# Patient Record
Sex: Female | Born: 1935 | Race: White | Hispanic: No
Health system: Southern US, Community
[De-identification: ages and names within clinical notes are randomized; demographics above are authoritative.]

## PROBLEM LIST (undated history)

## (undated) DIAGNOSIS — F329 Major depressive disorder, single episode, unspecified: Secondary | ICD-10-CM

## (undated) DIAGNOSIS — F0390 Unspecified dementia without behavioral disturbance: Secondary | ICD-10-CM

## (undated) DIAGNOSIS — I10 Essential (primary) hypertension: Secondary | ICD-10-CM

## (undated) DIAGNOSIS — F32A Depression, unspecified: Secondary | ICD-10-CM

---

## 1999-07-20 ENCOUNTER — Encounter: Payer: Self-pay | Admitting: Internal Medicine

## 1999-07-20 ENCOUNTER — Ambulatory Visit (HOSPITAL_COMMUNITY): Admission: RE | Admit: 1999-07-20 | Discharge: 1999-07-20 | Payer: Self-pay | Admitting: Internal Medicine

## 2000-03-08 ENCOUNTER — Ambulatory Visit (HOSPITAL_BASED_OUTPATIENT_CLINIC_OR_DEPARTMENT_OTHER): Admission: RE | Admit: 2000-03-08 | Discharge: 2000-03-08 | Payer: Self-pay | Admitting: Otolaryngology

## 2000-05-30 ENCOUNTER — Other Ambulatory Visit: Admission: RE | Admit: 2000-05-30 | Discharge: 2000-05-30 | Payer: Self-pay | Admitting: *Deleted

## 2001-02-10 ENCOUNTER — Ambulatory Visit (HOSPITAL_COMMUNITY): Admission: RE | Admit: 2001-02-10 | Discharge: 2001-02-10 | Payer: Self-pay | Admitting: Internal Medicine

## 2001-02-10 ENCOUNTER — Encounter: Payer: Self-pay | Admitting: Internal Medicine

## 2001-02-14 ENCOUNTER — Ambulatory Visit (HOSPITAL_COMMUNITY): Admission: RE | Admit: 2001-02-14 | Discharge: 2001-02-14 | Payer: Self-pay | Admitting: Internal Medicine

## 2001-02-14 ENCOUNTER — Encounter: Payer: Self-pay | Admitting: Internal Medicine

## 2003-03-25 ENCOUNTER — Ambulatory Visit: Admission: RE | Admit: 2003-03-25 | Discharge: 2003-05-21 | Payer: Self-pay | Admitting: Radiation Oncology

## 2003-04-21 ENCOUNTER — Other Ambulatory Visit: Admission: RE | Admit: 2003-04-21 | Discharge: 2003-04-21 | Payer: Self-pay | Admitting: *Deleted

## 2003-05-11 ENCOUNTER — Ambulatory Visit (HOSPITAL_BASED_OUTPATIENT_CLINIC_OR_DEPARTMENT_OTHER): Admission: RE | Admit: 2003-05-11 | Discharge: 2003-05-11 | Payer: Self-pay | Admitting: Otolaryngology

## 2005-04-14 ENCOUNTER — Encounter: Admission: RE | Admit: 2005-04-14 | Discharge: 2005-04-14 | Payer: Self-pay | Admitting: Internal Medicine

## 2005-10-29 ENCOUNTER — Encounter: Admission: RE | Admit: 2005-10-29 | Discharge: 2005-10-29 | Payer: Self-pay | Admitting: Internal Medicine

## 2007-05-06 ENCOUNTER — Emergency Department (HOSPITAL_COMMUNITY): Admission: EM | Admit: 2007-05-06 | Discharge: 2007-05-06 | Payer: Self-pay | Admitting: Family Medicine

## 2007-06-24 ENCOUNTER — Encounter (INDEPENDENT_AMBULATORY_CARE_PROVIDER_SITE_OTHER): Payer: Self-pay | Admitting: Otolaryngology

## 2007-06-24 ENCOUNTER — Ambulatory Visit (HOSPITAL_BASED_OUTPATIENT_CLINIC_OR_DEPARTMENT_OTHER): Admission: RE | Admit: 2007-06-24 | Discharge: 2007-06-24 | Payer: Self-pay | Admitting: Otolaryngology

## 2008-09-13 ENCOUNTER — Emergency Department (HOSPITAL_COMMUNITY): Admission: EM | Admit: 2008-09-13 | Discharge: 2008-09-13 | Payer: Self-pay | Admitting: Emergency Medicine

## 2009-08-12 ENCOUNTER — Encounter: Admission: RE | Admit: 2009-08-12 | Discharge: 2009-08-12 | Payer: Self-pay | Admitting: Family Medicine

## 2010-02-19 ENCOUNTER — Encounter: Payer: Self-pay | Admitting: Internal Medicine

## 2010-03-07 ENCOUNTER — Other Ambulatory Visit: Payer: Self-pay | Admitting: Family Medicine

## 2010-03-07 DIAGNOSIS — Z09 Encounter for follow-up examination after completed treatment for conditions other than malignant neoplasm: Secondary | ICD-10-CM

## 2010-03-13 ENCOUNTER — Ambulatory Visit
Admission: RE | Admit: 2010-03-13 | Discharge: 2010-03-13 | Disposition: A | Discharge status: Home / Self Care | Payer: Medicare Other | Source: Ambulatory Visit | Attending: Family Medicine | Admitting: Family Medicine

## 2010-03-13 ENCOUNTER — Other Ambulatory Visit: Payer: Self-pay | Admitting: Family Medicine

## 2010-03-13 DIAGNOSIS — Z09 Encounter for follow-up examination after completed treatment for conditions other than malignant neoplasm: Secondary | ICD-10-CM

## 2010-05-06 LAB — CBC
HCT: 40.7 % (ref 36.0–46.0)
Hemoglobin: 14.1 g/dL (ref 12.0–15.0)
RDW: 13.2 % (ref 11.5–15.5)
WBC: 6.2 10*3/uL (ref 4.0–10.5)

## 2010-05-06 LAB — DIFFERENTIAL
Basophils Absolute: 0 10*3/uL (ref 0.0–0.1)
Lymphocytes Relative: 12 % (ref 12–46)
Lymphs Abs: 0.7 10*3/uL (ref 0.7–4.0)
Monocytes Relative: 8 % (ref 3–12)
Neutrophils Relative %: 77 % (ref 43–77)

## 2010-05-06 LAB — BASIC METABOLIC PANEL
CO2: 25 mEq/L (ref 19–32)
Calcium: 9.8 mg/dL (ref 8.4–10.5)
Chloride: 98 mEq/L (ref 96–112)

## 2010-05-06 LAB — URINALYSIS, ROUTINE W REFLEX MICROSCOPIC
Protein, ur: NEGATIVE mg/dL
pH: 7.5 (ref 5.0–8.0)

## 2010-06-13 NOTE — Op Note (Signed)
[name], [name]               ACCOUNT NO.:  [id number]   MEDICAL RECORD NO.:  [id number]          PATIENT TYPE:  AMB   LOCATION:  DSC                          FACILITY:  [facility]   PHYSICIAN:  Christopher E. Ezzard Standing, M.D.DATE OF BIRTH:  1932/06/13   DATE OF PROCEDURE:  06/24/2007  DATE OF DISCHARGE:                               OPERATIVE REPORT   PREOPERATIVE DIAGNOSIS:  Left neck mass.   POSTOPERATIVE DIAGNOSIS:  Left neck mass.   FINDINGS:  Consistent with neuroma of the great auricular nerve.   OPERATION:  Excisional biopsy of the left neck nodule.   SURGEON:  Kristine Garbe. Ezzard Standing, MD   ANESTHESIA:  Local 1% Xylocaine with epinephrine.   COMPLICATIONS:  None.   BRIEF CLINICAL NOTE:  Ashley Aguirre is a 77 year old female who presents  here because of a persistent left neck nodule, she has had for a number  of months.  On examination this measures approximately one-half to the 2  cm in size, lies directly on top of the sternocleidomastoid muscle.  It  is in the region of a previous scar from a parotidectomy she had in  1990.  The nodule is a little bit tender to palpation.  She feels like  it has been getting a little bit larger and was taken to the operating  room at this time for excisional biopsy of left neck node.   DESCRIPTION OF PROCEDURE:  The patient was brought to the operating  room, the neck was prepped with Betadine solution.  The area of the  nodule was injected with Xylocaine with epinephrine for local  anesthetic.  An incision was made directly over the nodule, which  incorporated the previous parotid incision.  Dissection was carried down  to a firm nodule.  It appeared to represent a neuroma or a fibroma that  involved the remaining greater auricular nerve, as the greater auricular  nerve went directly into this.  The nodule was dissected off the  sternocleidomastoid muscle.  The great auricular nerve was transected  proximally and the nodule was  removed.  This was sent to pathology.  There was minimal bleeding, controlled with pressure and Xylocaine with  epinephrine.  Small wound was then closed with 4-0 chromic sutures  subcutaneously and 6-0 nylon reapproximating skin edges.  Bacitracin  ointment dressing was applied.  Bird tolerated this well and was  subsequently discharged to home.   DISPOSITION:  Ashley Aguirre is discharged to home on Tylenol p.r.n. pain.  I  will follow up her in my office in 6 days to review pathology and have  sutures removed.           ______________________________  Kristine Garbe Ezzard Standing, M.D.    CEN/MEDQ  D:  06/24/2007  T:  06/25/2007  Job:  [id number]

## 2010-06-16 NOTE — Op Note (Signed)
[facility]  Patient:    Ashley Aguirre, Ashley Aguirre                      MRN: [id number] Proc. Date: 03/08/00 Adm. Date:  [id number] Attending:  Carlean Purl CC:         Lilly Cove, M.D.   Operative Report  PREOPERATIVE DIAGNOSIS:  Left earlobe mass, 3 cm size.  POSTOPERATIVE DIAGNOSIS:  Left earlobe mass, 3 cm size, consistent with probable keloid.  PROCEDURE:  Excision of 3 cm left earlobe mass with complex closure with local skin flap.  SURGEON:  Kristine Garbe. Ezzard Standing, M.D.  ANESTHESIA:  Local 2% Xylocaine with 1:100,000 epinephrine, 4 cc.  COMPLICATIONS:  None.  BRIEF CLINICAL NOTE:  [name] is a 77 year old female who has had previous left carotid surgery several years ago.  She had apparent lesion that was removed two years ago in [place], and pathology report on this apparently represented a keloid.  She has had recurrence of the mass on the left earlobe replacing most of the earlobe with scar tissue from the earlobe to the left neck.  She is taken to the operating room at this time for excision of left earlobe mass with revision of scar tissue.  DESCRIPTION OF PROCEDURE:  The patient was taken to the operating room.  The ear was prepped with Betadine solution, and the earlobe was injected with 4 cc of Xylocaine with epinephrine.  The mass which represented apparent scar tissue was excised.  Flaps were elevated locally, and the defect was closed with 5-0 Vicryl sutures subcutaneously and 6-0 nylon on the skin.  Bacitracin ointment was applied, and a mastoid type ear dressing was applied.  This completed the procedure.  Kiyanna was sent home on Keflex 500 t.i.d. for 5 days, Tylenol and Tylenol No. 3 p.r.n. pain.  Will have her follow up in my office in one week to review pathology and to have sutures removed. DD:  03/08/00 TD:  03/09/00 Job: [id number]

## 2010-06-16 NOTE — Op Note (Signed)
[name], CURA                         ACCOUNT NO.:  [id number]   MEDICAL RECORD NO.:  [id number]                   PATIENT TYPE:  AMB   LOCATION:  DSC                                  FACILITY:  [facility]   PHYSICIAN:  Christopher E. Ezzard Standing, M.D.         DATE OF BIRTH:  1932/10/27   DATE OF PROCEDURE:  05/11/2003  DATE OF DISCHARGE:                                 OPERATIVE REPORT   PREOPERATIVE DIAGNOSIS:  Left ear Keloid (2.5 cm).   POSTOPERATIVE DIAGNOSIS:  Left ear Keloid (2.5 cm).   OPERATION PERFORMED:  Excision of left ear keloid with intermediate closure.   SURGEON:  Kristine Garbe. Ezzard Standing, M.D.   ANESTHESIA:  1% Xylocaine with 1:100,000 epinephrine.   COMPLICATIONS:  None.   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  She  had excision of the keloid twice before and has had recurrence of the keloid  times two.  Now has an approximately 2 to 2.5 cm keloid behind her left ear  lobe.  She is taken to the operating room at this time for excision of  keloid with primary closure.  Will have postoperative radiation therapy to  try to reduce recurrence of the keloid.   DESCRIPTION OF PROCEDURE:  The left ear was prepped with Betadine solution,  was injected with Xylocaine with epinephrine for local anesthetic.  The  keloid was excised from behind the left earlobe.  There was approximately 2  to 3 cm defect after excision of the keloid.  Small 1 cm skin flaps were  elevated anteriorly and posteriorly.  The defect was closed with 5-0 and 6-0  nylon sutures.  After closing the incision, bacitracin ointment and dressing  was applied.   DISPOSITION:  Ashley Aguirre is discharged home.  She is scheduled for postoperative  radiation therapy this afternoon.  She was given Tylenol and Tylenol #3 as  needed for pain.  Keflex 500 mg three times daily for three days.  Will have  her follow  up in my office in one week for recheck and remove the sutures.                                               Kristine Garbe. Ezzard Standing, M.D.    CEN/MEDQ  D:  05/11/2003  T:  05/12/2003  Job:  [id number]

## 2010-10-24 LAB — CBC
HCT: 39.4
Hemoglobin: 13.5
MCV: 95.6
RBC: 4.13

## 2010-10-24 LAB — DIFFERENTIAL
Basophils Absolute: 0
Basophils Relative: 0
Eosinophils Absolute: 0.3
Lymphs Abs: 2.5
Monocytes Relative: 10
Neutro Abs: 6.1
Neutrophils Relative %: 61

## 2010-10-24 LAB — POCT I-STAT, CHEM 8
BUN: 24 — ABNORMAL HIGH
Calcium, Ion: 1.2
Chloride: 100
Creatinine, Ser: 1.6 — ABNORMAL HIGH
HCT: 40
Hemoglobin: 13.6
Potassium: 4.1
Sodium: 132 — ABNORMAL LOW

## 2010-10-24 LAB — URINALYSIS, ROUTINE W REFLEX MICROSCOPIC
Bilirubin Urine: NEGATIVE
Glucose, UA: NEGATIVE
Hgb urine dipstick: NEGATIVE
Ketones, ur: NEGATIVE
Protein, ur: NEGATIVE

## 2011-07-10 ENCOUNTER — Other Ambulatory Visit: Payer: Self-pay | Admitting: Family Medicine

## 2011-07-10 DIAGNOSIS — Z780 Asymptomatic menopausal state: Secondary | ICD-10-CM

## 2011-07-10 DIAGNOSIS — Z1231 Encounter for screening mammogram for malignant neoplasm of breast: Secondary | ICD-10-CM

## 2011-07-30 ENCOUNTER — Other Ambulatory Visit: Payer: Medicare Other

## 2011-07-30 ENCOUNTER — Ambulatory Visit
Admission: RE | Admit: 2011-07-30 | Discharge: 2011-07-30 | Disposition: A | Discharge status: Home / Self Care | Payer: Medicare Other | Source: Ambulatory Visit | Attending: Family Medicine | Admitting: Family Medicine

## 2011-07-30 DIAGNOSIS — Z1231 Encounter for screening mammogram for malignant neoplasm of breast: Secondary | ICD-10-CM

## 2011-08-07 ENCOUNTER — Ambulatory Visit
Admission: RE | Admit: 2011-08-07 | Discharge: 2011-08-07 | Disposition: A | Discharge status: Home / Self Care | Payer: Medicare Other | Source: Ambulatory Visit | Attending: Family Medicine | Admitting: Family Medicine

## 2011-08-07 DIAGNOSIS — Z780 Asymptomatic menopausal state: Secondary | ICD-10-CM

## 2012-07-04 ENCOUNTER — Other Ambulatory Visit: Payer: Self-pay

## 2012-07-04 DIAGNOSIS — Z1231 Encounter for screening mammogram for malignant neoplasm of breast: Secondary | ICD-10-CM

## 2012-08-06 ENCOUNTER — Ambulatory Visit
Admission: RE | Admit: 2012-08-06 | Discharge: 2012-08-06 | Disposition: A | Discharge status: Home / Self Care | Payer: Medicare Other | Source: Ambulatory Visit

## 2012-08-06 DIAGNOSIS — Z1231 Encounter for screening mammogram for malignant neoplasm of breast: Secondary | ICD-10-CM

## 2012-08-07 ENCOUNTER — Other Ambulatory Visit: Payer: Self-pay | Admitting: Family Medicine

## 2012-08-07 DIAGNOSIS — R928 Other abnormal and inconclusive findings on diagnostic imaging of breast: Secondary | ICD-10-CM

## 2012-08-22 ENCOUNTER — Ambulatory Visit
Admission: RE | Admit: 2012-08-22 | Discharge: 2012-08-22 | Disposition: A | Discharge status: Home / Self Care | Payer: Medicare Other | Source: Ambulatory Visit | Attending: Family Medicine | Admitting: Family Medicine

## 2012-08-22 DIAGNOSIS — R928 Other abnormal and inconclusive findings on diagnostic imaging of breast: Secondary | ICD-10-CM

## 2013-09-01 ENCOUNTER — Other Ambulatory Visit: Payer: Self-pay

## 2013-09-01 DIAGNOSIS — Z1231 Encounter for screening mammogram for malignant neoplasm of breast: Secondary | ICD-10-CM

## 2013-09-16 ENCOUNTER — Ambulatory Visit
Admission: RE | Admit: 2013-09-16 | Discharge: 2013-09-16 | Disposition: A | Discharge status: Home / Self Care | Payer: Medicare Other | Source: Ambulatory Visit

## 2013-09-16 DIAGNOSIS — Z1231 Encounter for screening mammogram for malignant neoplasm of breast: Secondary | ICD-10-CM

## 2014-02-23 ENCOUNTER — Other Ambulatory Visit: Payer: Self-pay | Admitting: Family Medicine

## 2014-02-23 DIAGNOSIS — E2839 Other primary ovarian failure: Secondary | ICD-10-CM

## 2014-02-26 ENCOUNTER — Other Ambulatory Visit: Payer: Medicare Other

## 2014-03-18 ENCOUNTER — Inpatient Hospital Stay: Admission: RE | Admit: 2014-03-18 | Payer: Medicare Other | Source: Ambulatory Visit

## 2014-03-23 ENCOUNTER — Ambulatory Visit
Admission: RE | Admit: 2014-03-23 | Discharge: 2014-03-23 | Disposition: A | Discharge status: Home / Self Care | Payer: Medicare Other | Source: Ambulatory Visit | Attending: Family Medicine | Admitting: Family Medicine

## 2014-03-23 DIAGNOSIS — E2839 Other primary ovarian failure: Secondary | ICD-10-CM

## 2015-06-15 ENCOUNTER — Other Ambulatory Visit: Payer: Self-pay

## 2015-06-15 DIAGNOSIS — Z1231 Encounter for screening mammogram for malignant neoplasm of breast: Secondary | ICD-10-CM

## 2015-06-30 ENCOUNTER — Ambulatory Visit
Admission: RE | Admit: 2015-06-30 | Discharge: 2015-06-30 | Disposition: A | Discharge status: Home / Self Care | Payer: Medicare Other | Source: Ambulatory Visit

## 2015-06-30 DIAGNOSIS — Z1231 Encounter for screening mammogram for malignant neoplasm of breast: Secondary | ICD-10-CM

## 2016-03-05 ENCOUNTER — Emergency Department (HOSPITAL_COMMUNITY): Payer: Medicare Other

## 2016-03-05 ENCOUNTER — Emergency Department (HOSPITAL_COMMUNITY)
Admission: EM | Admit: 2016-03-05 | Discharge: 2016-03-05 | Disposition: A | Discharge status: Home / Self Care | Payer: Medicare Other | Attending: Emergency Medicine | Admitting: Emergency Medicine

## 2016-03-05 ENCOUNTER — Encounter (HOSPITAL_COMMUNITY): Payer: Self-pay | Admitting: Emergency Medicine

## 2016-03-05 DIAGNOSIS — W19XXXA Unspecified fall, initial encounter: Secondary | ICD-10-CM | POA: Insufficient documentation

## 2016-03-05 DIAGNOSIS — Z79899 Other long term (current) drug therapy: Secondary | ICD-10-CM | POA: Insufficient documentation

## 2016-03-05 DIAGNOSIS — Y9289 Other specified places as the place of occurrence of the external cause: Secondary | ICD-10-CM | POA: Diagnosis not present

## 2016-03-05 DIAGNOSIS — I10 Essential (primary) hypertension: Secondary | ICD-10-CM | POA: Insufficient documentation

## 2016-03-05 DIAGNOSIS — Y999 Unspecified external cause status: Secondary | ICD-10-CM | POA: Insufficient documentation

## 2016-03-05 DIAGNOSIS — Y939 Activity, unspecified: Secondary | ICD-10-CM | POA: Diagnosis not present

## 2016-03-05 DIAGNOSIS — Z7982 Long term (current) use of aspirin: Secondary | ICD-10-CM | POA: Insufficient documentation

## 2016-03-05 DIAGNOSIS — S0990XA Unspecified injury of head, initial encounter: Secondary | ICD-10-CM | POA: Insufficient documentation

## 2016-03-05 HISTORY — DX: Major depressive disorder, single episode, unspecified: F32.9

## 2016-03-05 HISTORY — DX: Depression, unspecified: F32.A

## 2016-03-05 HISTORY — DX: Unspecified dementia, unspecified severity, without behavioral disturbance, psychotic disturbance, mood disturbance, and anxiety: F03.90

## 2016-03-05 HISTORY — DX: Essential (primary) hypertension: I10

## 2016-03-05 NOTE — ED Triage Notes (Signed)
Pt is from [facility].  Pt was in dining room.  Staff found her laying on the floor with chair turned over beside her.  Other residents state that Pt fell striking her left shoulder and head.  No LOC reported.  Dementia Dx.  Pt is alert to self only at baseline and is currently at baseline.  Pt C/O no pain.

## 2016-03-05 NOTE — Discharge Instructions (Signed)
Continue taking your home medications as prescribed. I recommend following up with your primary care provider within the next week for follow-up evaluation. Please return to the Emergency Department if symptoms worsen or new onset of fever, headache, visual changes, confusion, altered mental status, chest pain, difficulty breathing, abdominal pain, vomiting, weakness.

## 2016-03-05 NOTE — ED Notes (Signed)
Patient given water and crackers for PO challenge.

## 2016-03-05 NOTE — ED Provider Notes (Signed)
[facility]-EMERGENCY DEPT Provider Note   CSN: [id number] Arrival date & time: 03/05/16  1227     History   Chief Complaint Chief Complaint  Patient presents with  . Fall    HPI Ashley Aguirre is a 83 y.o. female.  HPI   Patient is a 83 year old female with history of dementia, depression, primary hyper parathyroidism and hypertension who presents the ED status post unwitnessed fall from nursing facility. EMS reports that patient was found laying on the floor on her side with her chair turned over beside her in the dining hall. EMS report other residents saw the patient fall out of her chair resulting her landing on her left shoulder and hitting her head. Denies LOC. Patient reports she was standing and in the process of going to sit in her chair when she fell on her left side. Pt denies LOC. Denies any pain or complaints at this time. Denies fever, chills, CP, SOB, lightheadedness, HA, visual changes, cough, abdominal pain, N/V/D, urinary sxs, rash, numbness, tingling, weakness. Pt's paperwork at bedside from nursing facility denies pt currently being on any anticoagulants. Pt reports using walker for baseline ambulation.  I spoke with pt's nurse at [facility] who presents pt fell out of her chair which resulted in her "clipping her head against the dining table" and then falling on the floor and hitting her head on the ground. Denies LOC. Nurse reports pt was at baseline mental status. Denies pt having any recent changes or new sxs recently.    LEVEL V CAVEAT- Dementia  Past Medical History:  Diagnosis Date  . Dementia   . Depression   . Hypertension     There are no active problems to display for this patient.   History reviewed. No pertinent surgical history.  OB History    No data available       Home Medications    Prior to Admission medications   Medication Sig Start Date End Date Taking? Authorizing Provider  acetaminophen (TYLENOL) 500 MG tablet Take 500 mg by  mouth every 4 (four) hours as needed (fever).   Yes Historical Provider, MD  amLODipine (NORVASC) 5 MG tablet Take 10 mg by mouth daily.   Yes Historical Provider, MD  aspirin EC 81 MG tablet Take 81 mg by mouth daily.   Yes Historical Provider, MD  benzonatate (TESSALON) 100 MG capsule Take 100 mg by mouth every 6 (six) hours as needed for cough.   Yes Historical Provider, MD  Carboxymeth-Glycerin-Polysorb (REFRESH OPTIVE ADVANCED) 0.5-1-0.5 % SOLN Place 1 drop into both eyes 3 (three) times daily.   Yes Historical Provider, MD  celecoxib (CELEBREX) 100 MG capsule Take 100 mg by mouth at bedtime.   Yes Historical Provider, MD  cholecalciferol (VITAMIN D) 1000 units tablet Take 1,000 Units by mouth daily.   Yes Historical Provider, MD  cyanocobalamin 500 MCG tablet Take 500 mcg by mouth daily.   Yes Historical Provider, MD  donepezil (ARICEPT) 10 MG tablet Take 10 mg by mouth at bedtime.   Yes Historical Provider, MD  FLUoxetine (PROZAC) 10 MG capsule Take 10 mg by mouth at bedtime.   Yes Historical Provider, MD  Lifitegrast Benay Spice) 5 % SOLN Apply 1 drop to eye 2 (two) times daily.   Yes Historical Provider, MD  Omega-3 Fatty Acids (FISH OIL) 1000 MG CAPS Take 2,000 mg by mouth 2 (two) times daily.   Yes Historical Provider, MD  simvastatin (ZOCOR) 20 MG tablet Take 20 mg by mouth  at bedtime.   Yes Historical Provider, MD    Family History History reviewed. No pertinent family history.  Social History Social History  Substance Use Topics  . Smoking status: Not on file  . Smokeless tobacco: Not on file  . Alcohol use No     Allergies   Patient has no known allergies.   Review of Systems Review of Systems  Unable to perform ROS: Dementia     Physical Exam Updated Vital Signs BP 147/62 (BP Location: Left Arm)   Pulse (!) 57   Temp 98.3 F (36.8 C) (Oral)   Resp 16   SpO2 95%   Physical Exam  Constitutional: She appears well-developed and well-nourished. No distress.    Well, nontoxic appearing elderly female in no acute distress  HENT:  Head: Normocephalic and atraumatic. Head is without raccoon's eyes, without Battle's sign, without abrasion, without contusion and without laceration.  Right Ear: Tympanic membrane normal. No hemotympanum.  Left Ear: Tympanic membrane normal. No hemotympanum.  Nose: Nose normal. No sinus tenderness, nasal deformity, septal deviation or nasal septal hematoma. No epistaxis.  Mouth/Throat: Uvula is midline, oropharynx is clear and moist and mucous membranes are normal. No oropharyngeal exudate, posterior oropharyngeal edema, posterior oropharyngeal erythema or tonsillar abscesses. No tonsillar exudate.  Eyes: Conjunctivae and EOM are normal. Pupils are equal, round, and reactive to light. Right eye exhibits no discharge. Left eye exhibits no discharge. No scleral icterus.  Neck: Normal range of motion. Neck supple.  Cardiovascular: Normal rate, regular rhythm, normal heart sounds and intact distal pulses.   Pulmonary/Chest: Effort normal and breath sounds normal. No respiratory distress. She has no wheezes. She has no rales. She exhibits no tenderness.  Abdominal: Soft. Bowel sounds are normal. She exhibits no distension and no mass. There is no tenderness. There is no rebound and no guarding. No hernia.  Musculoskeletal: Normal range of motion. She exhibits no edema, tenderness or deformity.       Right shoulder: Normal.       Left shoulder: Normal.       Right hip: Normal.       Left hip: Normal.  No cervical, thoracic, or lumbar spine midline TTP. Full ROM of bilateral upper and lower extremities with 5/5 strength.  FROM of bilateral hips with 5/5 strength, no pelvic instability.  2+ radial and DP pulses. Sensation grossly intact.   Lymphadenopathy:    She has no cervical adenopathy.  Neurological: She is alert. She has normal strength. No cranial nerve deficit or sensory deficit. Coordination normal. GCS eye subscore is 4.  GCS verbal subscore is 5. GCS motor subscore is 6.  Pt alert and oriented to person and place only. Pt cooperative and following commands. Pt spontaneously moving all 4 extremities.   Skin: Skin is warm and dry. She is not diaphoretic.  Nursing note and vitals reviewed.    ED Treatments / Results  Labs (all labs ordered are listed, but only abnormal results are displayed) Labs Reviewed - No data to display  EKG  EKG Interpretation None       Radiology Ct Head Wo Contrast  Result Date: 03/05/2016 CLINICAL DATA:  83 year old female found lying on floor next to chair. May have struck head. No loss of consciousness reported. Back to baseline. Initial encounter. EXAM: CT HEAD WITHOUT CONTRAST TECHNIQUE: Contiguous axial images were obtained from the base of the skull through the vertex without intravenous contrast. COMPARISON:  09/13/2008 CT. FINDINGS: Brain: No intracranial hemorrhage or CT  evidence of large acute infarct. Mild chronic microvascular changes. Global atrophy without hydrocephalus. No intracranial mass lesion noted on this unenhanced exam. Vascular: Vascular calcifications Skull: No skull fracture Sinuses/Orbits: No acute orbital abnormality post lens replacement. Visualized sinuses are clear. Other: Negative IMPRESSION: No skull fracture or intracranial hemorrhage. No CT evidence of large acute infarct. Atrophy. Electronically Signed   By: Lacy DuverneySteven  Olson M.D.   On: 03/05/2016 13:47    Procedures Procedures (including critical care time)  Medications Ordered in ED Medications - No data to display   Initial Impression / Assessment and Plan / ED Course  I have reviewed the triage vital signs and the nursing notes.  Pertinent labs & imaging results that were available during my care of the patient were reviewed by me and considered in my medical decision making (see chart for details).     Patient presents from nursing facility with reported mechanical fall with associated  head injury. Denies LOC. Denies use of anticoagulants. Nursing facility denies pt having any other changes or new/concerning sxs. Patient is at baseline mental status. History of dementia. Patient denies any pain or complaints. VSS. Exam unremarkable. No evidence of head injury. No neuro deficits. Full range of motion of bilateral upper and lower extremities is 5 out of 5 strength. CT head unremarkable. Patient able to stand and ambulate with walker in the ED. Discussed pt with Dr. Clydene PughKnott. Plan to d/c pt back to nursing facility with PCP follow up. Discussed results and plan for discharge with patient and daughter at bedside.  Final Clinical Impressions(s) / ED Diagnoses   Final diagnoses:  Fall, initial encounter  Injury of head, initial encounter    New Prescriptions New Prescriptions   No medications on file     Barrett Henleicole Elizabeth Verlia Kaney, [place] JerseyPA-C 03/05/16 1514    Lyndal Pulleyaniel Knott, MD 03/05/16 2012

## 2016-03-05 NOTE — ED Notes (Signed)
Pt was able to ambulate in room with walker.  Clean brief put on Pt and peri care provided.

## 2016-10-01 ENCOUNTER — Emergency Department (HOSPITAL_COMMUNITY): Payer: Medicare Other

## 2016-10-01 ENCOUNTER — Emergency Department (HOSPITAL_COMMUNITY)
Admission: EM | Admit: 2016-10-01 | Discharge: 2016-10-01 | Disposition: A | Discharge status: Home / Self Care | Payer: Medicare Other | Attending: Emergency Medicine | Admitting: Emergency Medicine

## 2016-10-01 ENCOUNTER — Encounter (HOSPITAL_COMMUNITY): Payer: Self-pay | Admitting: Emergency Medicine

## 2016-10-01 DIAGNOSIS — M791 Myalgia: Secondary | ICD-10-CM | POA: Insufficient documentation

## 2016-10-01 DIAGNOSIS — Z7982 Long term (current) use of aspirin: Secondary | ICD-10-CM | POA: Insufficient documentation

## 2016-10-01 DIAGNOSIS — M25551 Pain in right hip: Secondary | ICD-10-CM | POA: Diagnosis present

## 2016-10-01 DIAGNOSIS — I10 Essential (primary) hypertension: Secondary | ICD-10-CM | POA: Diagnosis not present

## 2016-10-01 DIAGNOSIS — Z79899 Other long term (current) drug therapy: Secondary | ICD-10-CM | POA: Insufficient documentation

## 2016-10-01 DIAGNOSIS — F0390 Unspecified dementia without behavioral disturbance: Secondary | ICD-10-CM | POA: Diagnosis not present

## 2016-10-01 DIAGNOSIS — M7918 Myalgia, other site: Secondary | ICD-10-CM

## 2016-10-01 DIAGNOSIS — W19XXXA Unspecified fall, initial encounter: Secondary | ICD-10-CM

## 2016-10-01 NOTE — ED Triage Notes (Signed)
Per EMS-states she fell a week ago today-was not evaluated-facility states she grimaces when bearing weight on right hip-abrasions to right knee

## 2016-10-01 NOTE — ED Provider Notes (Signed)
[facility]-EMERGENCY DEPT Provider Note   CSN: [id number] Arrival date & time: 10/01/16  1410     History   Chief Complaint Chief Complaint  Patient presents with  . Hip Pain    HPI Ashley Aguirre is a 83 y.o. female.  HPI  83 y.o. female with a hx of HTN, Dementia, presents to the Emergency Department today via EMS due to fall x 1 week ago. Pt resides at assisted nursing facility. Per daughter, who was at facility, pt was yelling out in pain and grimacing when bearing weight. Sent for eval due to falls. No head trauma or LOC. Pt does not use blood thinners. Pt denies pain currently. Denies pain with ROM of legs. No numbness/tingling. No headaches. No CP/SOB/ABD pain. No fevers. No cough. No dysuria. No other symptoms noted.    Past Medical History:  Diagnosis Date  . Dementia   . Depression   . Hypertension     There are no active problems to display for this patient.   History reviewed. No pertinent surgical history.  OB History    No data available       Home Medications    Prior to Admission medications   Medication Sig Start Date End Date Taking? Authorizing Provider  acetaminophen (TYLENOL) 500 MG tablet Take 500 mg by mouth every 4 (four) hours as needed (fever).    [provider]  amLODipine (NORVASC) 5 MG tablet Take 10 mg by mouth daily.    [provider]  aspirin EC 81 MG tablet Take 81 mg by mouth daily.    [provider]  benzonatate (TESSALON) 100 MG capsule Take 100 mg by mouth every 6 (six) hours as needed for cough.    [provider]  Carboxymeth-Glycerin-Polysorb (REFRESH OPTIVE ADVANCED) 0.5-1-0.5 % SOLN Place 1 drop into both eyes 3 (three) times daily.    [provider]  celecoxib (CELEBREX) 100 MG capsule Take 100 mg by mouth at bedtime.    [provider]  cholecalciferol (VITAMIN D) 1000 units tablet Take 1,000 Units by mouth daily.    [provider]  cyanocobalamin 500 MCG tablet  Take 500 mcg by mouth daily.    [provider]  donepezil (ARICEPT) 10 MG tablet Take 10 mg by mouth at bedtime.    [provider]  FLUoxetine (PROZAC) 10 MG capsule Take 10 mg by mouth at bedtime.    [provider]  Lifitegrast Benay Spice) 5 % SOLN Apply 1 drop to eye 2 (two) times daily.    [provider]  Omega-3 Fatty Acids (FISH OIL) 1000 MG CAPS Take 2,000 mg by mouth 2 (two) times daily.    [provider]  simvastatin (ZOCOR) 20 MG tablet Take 20 mg by mouth at bedtime.    [provider]    Family History No family history on file.  Social History Social History  Substance Use Topics  . Smoking status: Not on file  . Smokeless tobacco: Not on file  . Alcohol use No     Allergies   Patient has no known allergies.   Review of Systems Review of Systems  Unable to perform ROS: Dementia     Physical Exam Updated Vital Signs BP (!) 141/58   Pulse 60   Temp 97.9 F (36.6 C) (Oral)   Resp 15   SpO2 96%   Physical Exam  Constitutional: She is oriented to person, place, and time. Vital signs are normal. She appears  well-developed and well-nourished. No distress.  HENT:  Head: Normocephalic and atraumatic.  Right Ear: Hearing, tympanic membrane, external ear and ear canal normal.  Left Ear: Hearing, tympanic membrane, external ear and ear canal normal.  Nose: Nose normal.  Mouth/Throat: Uvula is midline, oropharynx is clear and moist and mucous membranes are normal. No trismus in the jaw. No oropharyngeal exudate, posterior oropharyngeal erythema or tonsillar abscesses.  Eyes: Pupils are equal, round, and reactive to light. Conjunctivae and EOM are normal.  Neck: Normal range of motion. Neck supple. No tracheal deviation present.  Cardiovascular: Normal rate, regular rhythm, S1 normal, S2 normal, normal heart sounds, intact distal pulses and normal pulses.   Pulmonary/Chest: Effort normal and breath sounds normal.  No respiratory distress. She has no decreased breath sounds. She has no wheezes. She has no rhonchi. She has no rales.  Abdominal: Normal appearance and bowel sounds are normal. There is no tenderness.  Musculoskeletal: Normal range of motion.  BLE exam without pain on internal/external rotation of left and right hip. No pain with adduction and abduction. Able to lift legs without pain. ROM knee/ankle without pain. No palpable or visible deformities. NVI  Neurological: She is alert and oriented to person, place, and time.  Skin: Skin is warm and dry.  Psychiatric: She has a normal mood and affect. Her speech is normal and behavior is normal. Thought content normal.  Nursing note and vitals reviewed.  ED Treatments / Results  Labs (all labs ordered are listed, but only abnormal results are displayed) Labs Reviewed - No data to display  EKG  EKG Interpretation None       Radiology Dg Hip Unilat  With Pelvis 2-3 Views Right  Result Date: 10/01/2016 CLINICAL DATA:  Right hip pain following a fall today. EXAM: DG HIP (WITH OR WITHOUT PELVIS) 2-3V RIGHT COMPARISON:  None. FINDINGS: Diffuse osteopenia. No fracture or dislocation seen. Lower lumbar spine degenerative changes. Mild left hip degenerative changes. IMPRESSION: 1. No fracture or dislocation. 2. Lower lumbar spine degenerative changes and mild left hip degenerative changes. Electronically Signed   By: Beckie Salts M.D.   On: 10/01/2016 15:09    Procedures Procedures (including critical care time)  Medications Ordered in ED Medications - No data to display   Initial Impression / Assessment and Plan / ED Course  I have reviewed the triage vital signs and the nursing notes.  Pertinent labs & imaging results that were available during my care of the patient were reviewed by me and considered in my medical decision making (see chart for details).  Final Clinical Impressions(s) / ED Diagnoses     {I have reviewed the relevant  previous healthcare records.  {I obtained HPI from historian.   ED Course:  Assessment: Pt is a 83 y.o. female with a hx of HTN, Dementia, presents to the Emergency Department today via EMS due to fall x 1 week ago. Pt resides at assisted nursing facility. Per daughter, who was at facility, pt was yelling out in pain and grimacing when bearing weight. Sent for eval due to falls. No head trauma or LOC. Pt does not use blood thinners. Pt denies pain currently. Denies pain with ROM of legs. No numbness/tingling. No headaches. No CP/SOB/ABD pain. No fevers. No cough. No dysuria. On exam, pt in NAD. Nontoxic/nonseptic appearing. VSS. Afebrile. Lungs CTA. Heart RRR. Abdomen nontender soft. BLE exam without pain on internal/external rotation of left and right hip. No pain with adduction and abduction. Able to lift  legs without pain. ROM knee/ankle without pain. No palpable or visible deformities. NVI. Xray imaging unremarkable. Plan is to DC home to faiclity. Seen by supervising physician who agrees with plan. At time of discharge, Patient is in no acute distress. Vital Signs are stable. Patient is able to ambulate. Patient able to tolerate PO.   Disposition/Plan:  DC Home Additional Verbal discharge instructions given and discussed with patient.  Pt Instructed to f/u with PCP in the next week for evaluation and treatment of symptoms. Return precautions given Pt acknowledges and agrees with plan  Supervising Physician Lorre NickAllen, Anthony, MD  Final diagnoses:  Fall, initial encounter  Musculoskeletal pain    New Prescriptions New Prescriptions   No medications on file     Wilber BihariMohr, Amberrose Friebel, PA-C 10/01/16 1528    Lorre NickAllen, Anthony, MD 10/01/16 [id number]

## 2016-10-01 NOTE — ED Notes (Signed)
Pt ambulated in hall with walker

## 2016-10-01 NOTE — Discharge Instructions (Signed)
Please read and follow all provided instructions.  Your diagnoses today include:  1. Fall, initial encounter   2. Musculoskeletal pain     Tests performed today include: Vital signs. See below for your results today.   Medications prescribed:  Take as prescribed   Home care instructions:  Follow any educational materials contained in this packet.  Follow-up instructions: Please follow-up with your primary care provider for further evaluation of symptoms and treatment   Return instructions:  Please return to the Emergency Department if you do not get better, if you get worse, or new symptoms OR  - Fever (temperature greater than 101.48F)  - Bleeding that does not stop with holding pressure to the area    -Severe pain (please note that you may be more sore the day after your accident)  - Chest Pain  - Difficulty breathing  - Severe nausea or vomiting  - Inability to tolerate food and liquids  - Passing out  - Skin becoming red around your wounds  - Change in mental status (confusion or lethargy)  - New numbness or weakness    Please return if you have any other emergent concerns.  Additional Information:  Your vital signs today were: BP (!) 141/58    Pulse 60    Temp 97.9 F (36.6 C) (Oral)    Resp 15    SpO2 96%  If your blood pressure (BP) was elevated above 135/85 this visit, please have this repeated by your doctor within one month. ---------------

## 2017-09-29 DEATH — deceased

## 2018-05-13 IMAGING — CT CT HEAD W/O CM
3 of 4 series · 14 of 47 positions shown, 16 images · non-contrast
Comparison: 09/13/2008 CT.

CLINICAL DATA: 83-year-old female found lying on floor next to
chair. May have struck head. No loss of consciousness reported. Back
to baseline. Initial encounter.

EXAM:
CT HEAD WITHOUT CONTRAST
TECHNIQUE: Contiguous axial images were obtained from the base of the skull
through the vertex without intravenous contrast.

[Series 2: head w/o · axial · non-contrast · 0.45mm/px · z∈[+1593,+1713]mm · 8 of 30 slices shown, 10 images]
[im 3/30  brain]
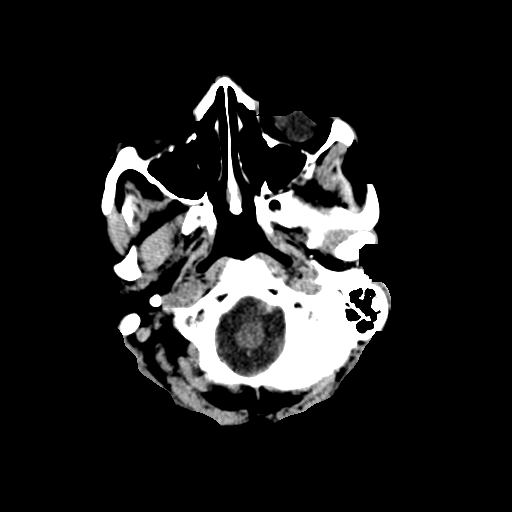
[im 3/30  bone]
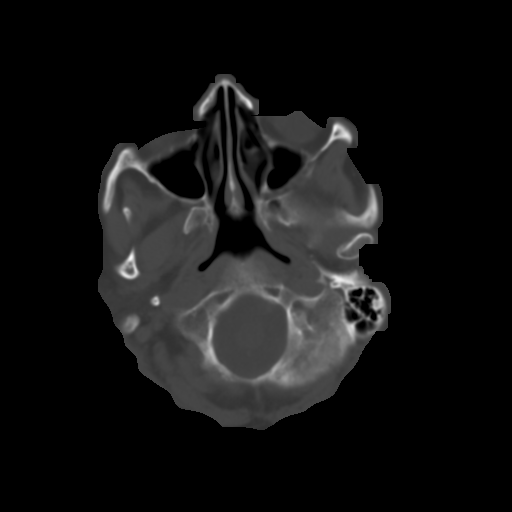
[im 6/30  brain]
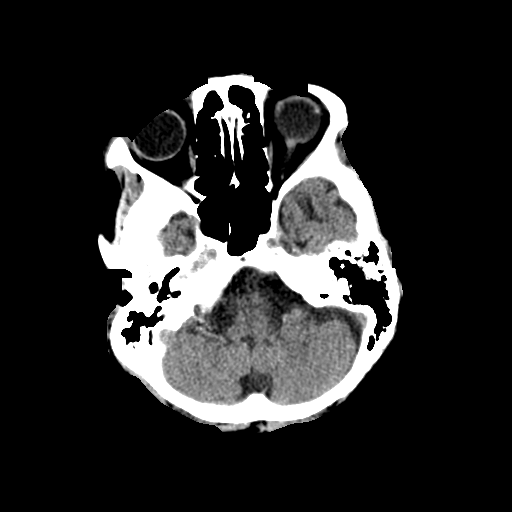
[im 9/30  brain]
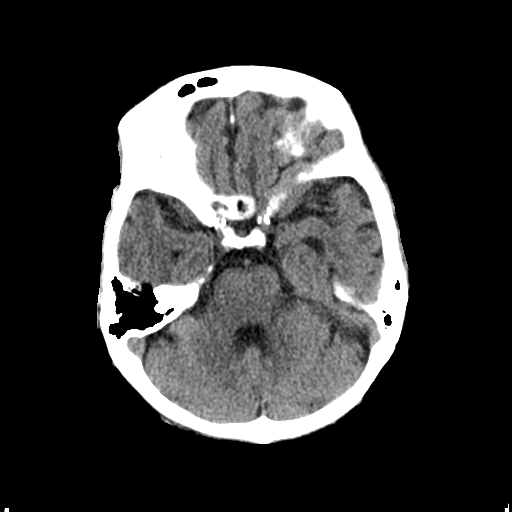
[im 12/30  brain]
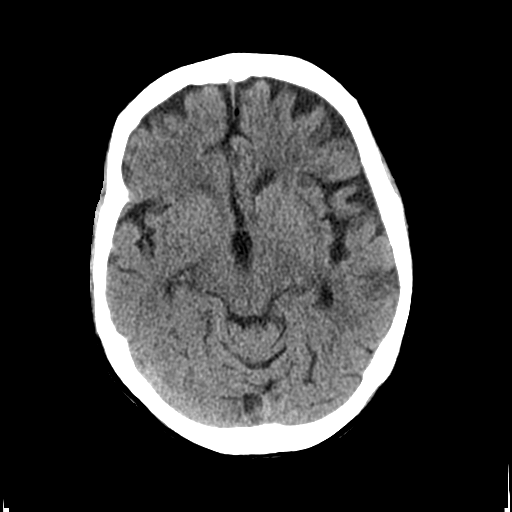
[im 18/30  brain]
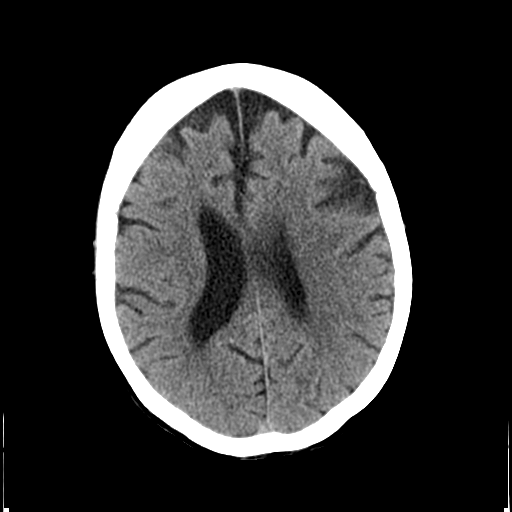
[im 18/30  bone]
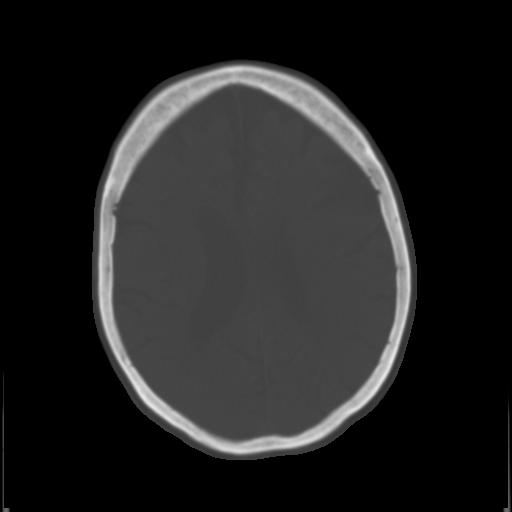
[im 21/30  brain]
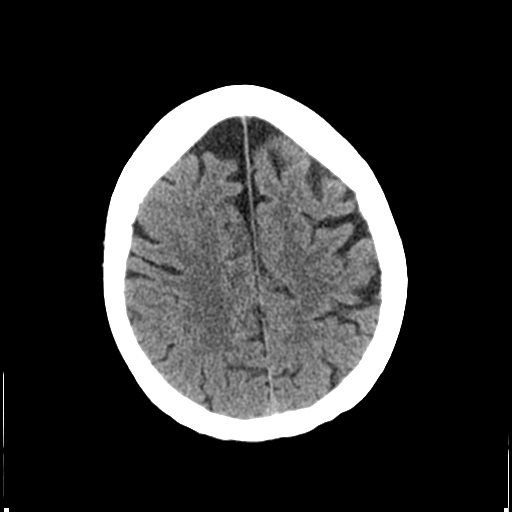
[im 24/30  brain]
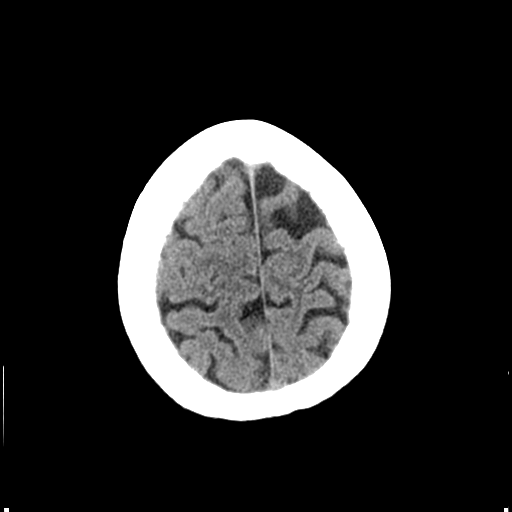
[im 27/30  brain]
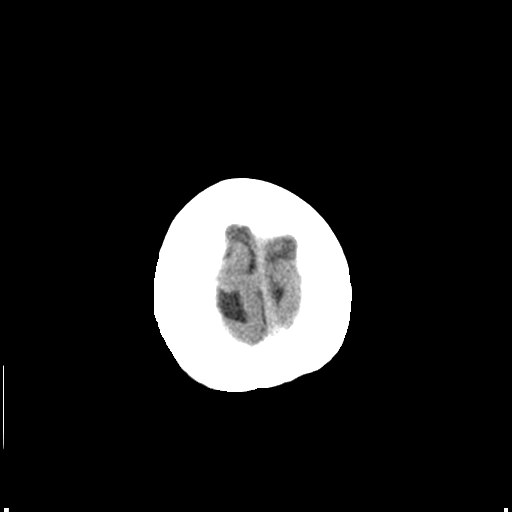

[Series 5: coronal · coronal · 0.29mm/px · 3 of 76 slices shown]
[im 26/76  brain]
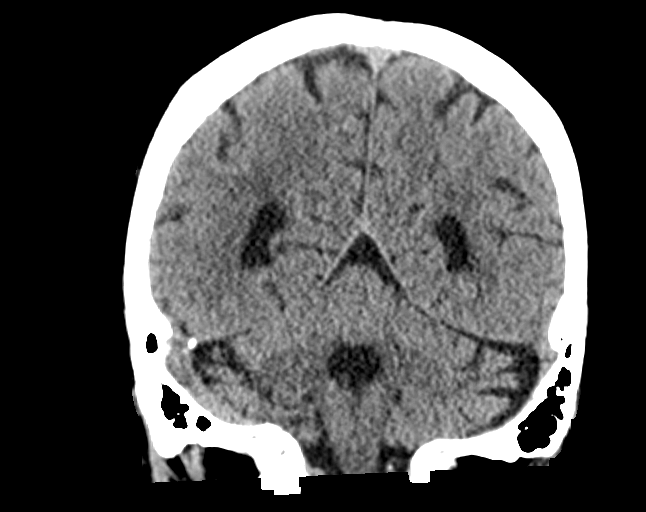
[im 34/76  brain]
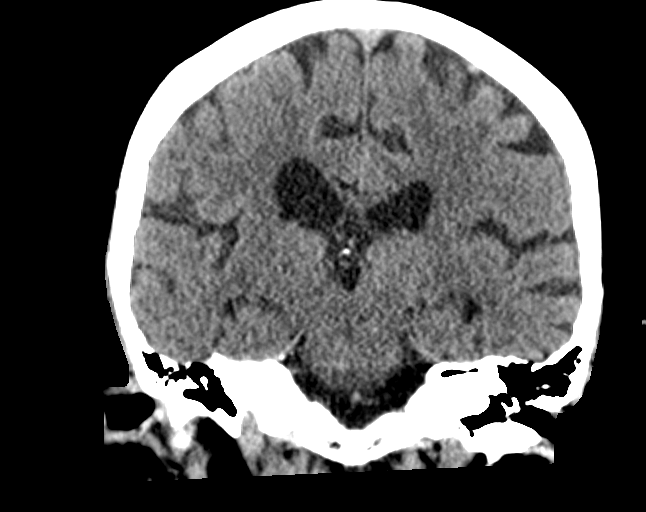
[im 42/76  brain]
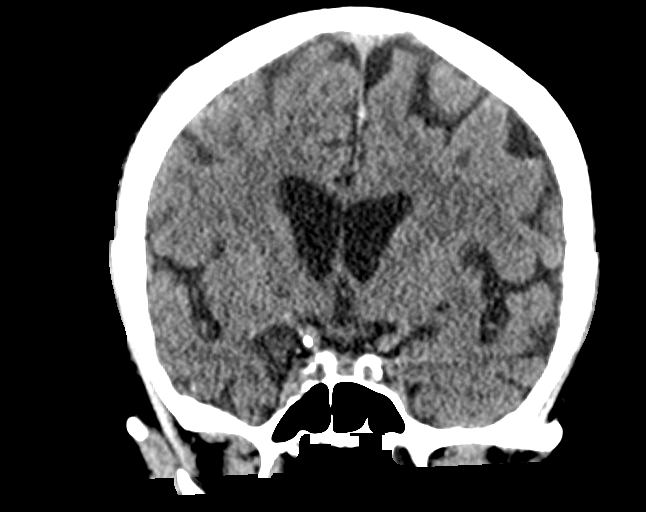

[Series 6: sagittal · sagittal · 0.29mm/px · 3 of 58 slices shown]
[im 20/58  brain]
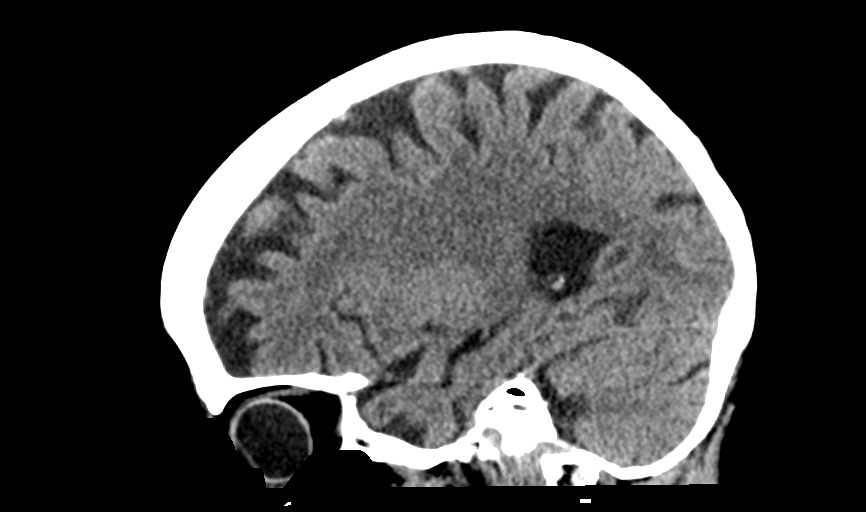
[im 29/58  brain]
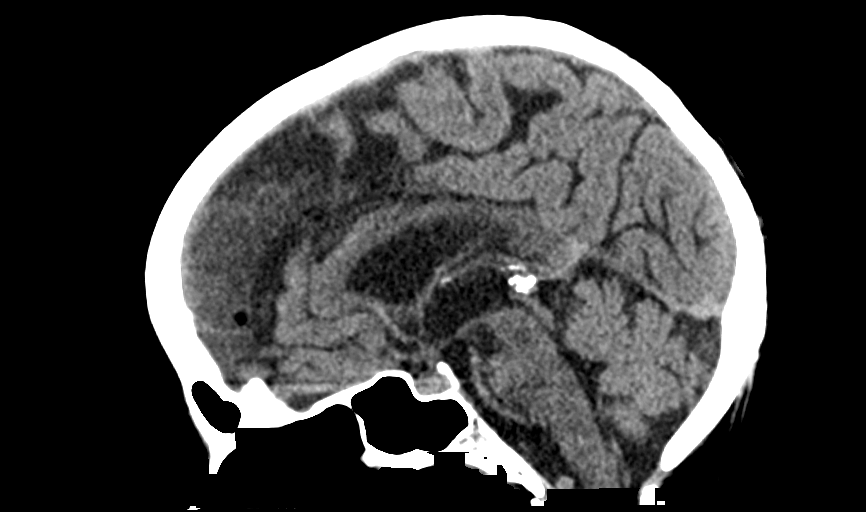
[im 39/58  brain]
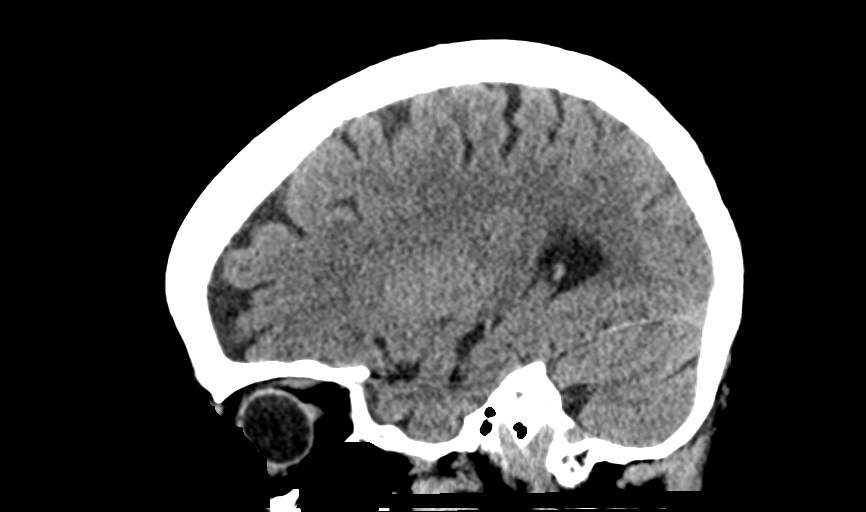

[14 of 47 positions shown; findings below may reference images not displayed]

FINDINGS: Brain: No intracranial hemorrhage or CT evidence of large acute
infarct.

Mild chronic microvascular changes.

Global atrophy without hydrocephalus.

No intracranial mass lesion noted on this unenhanced exam.

Vascular: Vascular calcifications

Skull: No skull fracture

Sinuses/Orbits: No acute orbital abnormality post lens replacement.
Visualized sinuses are clear.

Other: Negative
IMPRESSION: No skull fracture or intracranial hemorrhage.

No CT evidence of large acute infarct.

Atrophy.

## 2018-12-09 IMAGING — CR DG HIP (WITH OR WITHOUT PELVIS) 2-3V*R*
3 series · 3 of 3 positions shown · non-contrast
Comparison: None.

CLINICAL DATA: Right hip pain following a fall today.

EXAM:
DG HIP (WITH OR WITHOUT PELVIS) 2-3V RIGHT

[t pelvis ap]
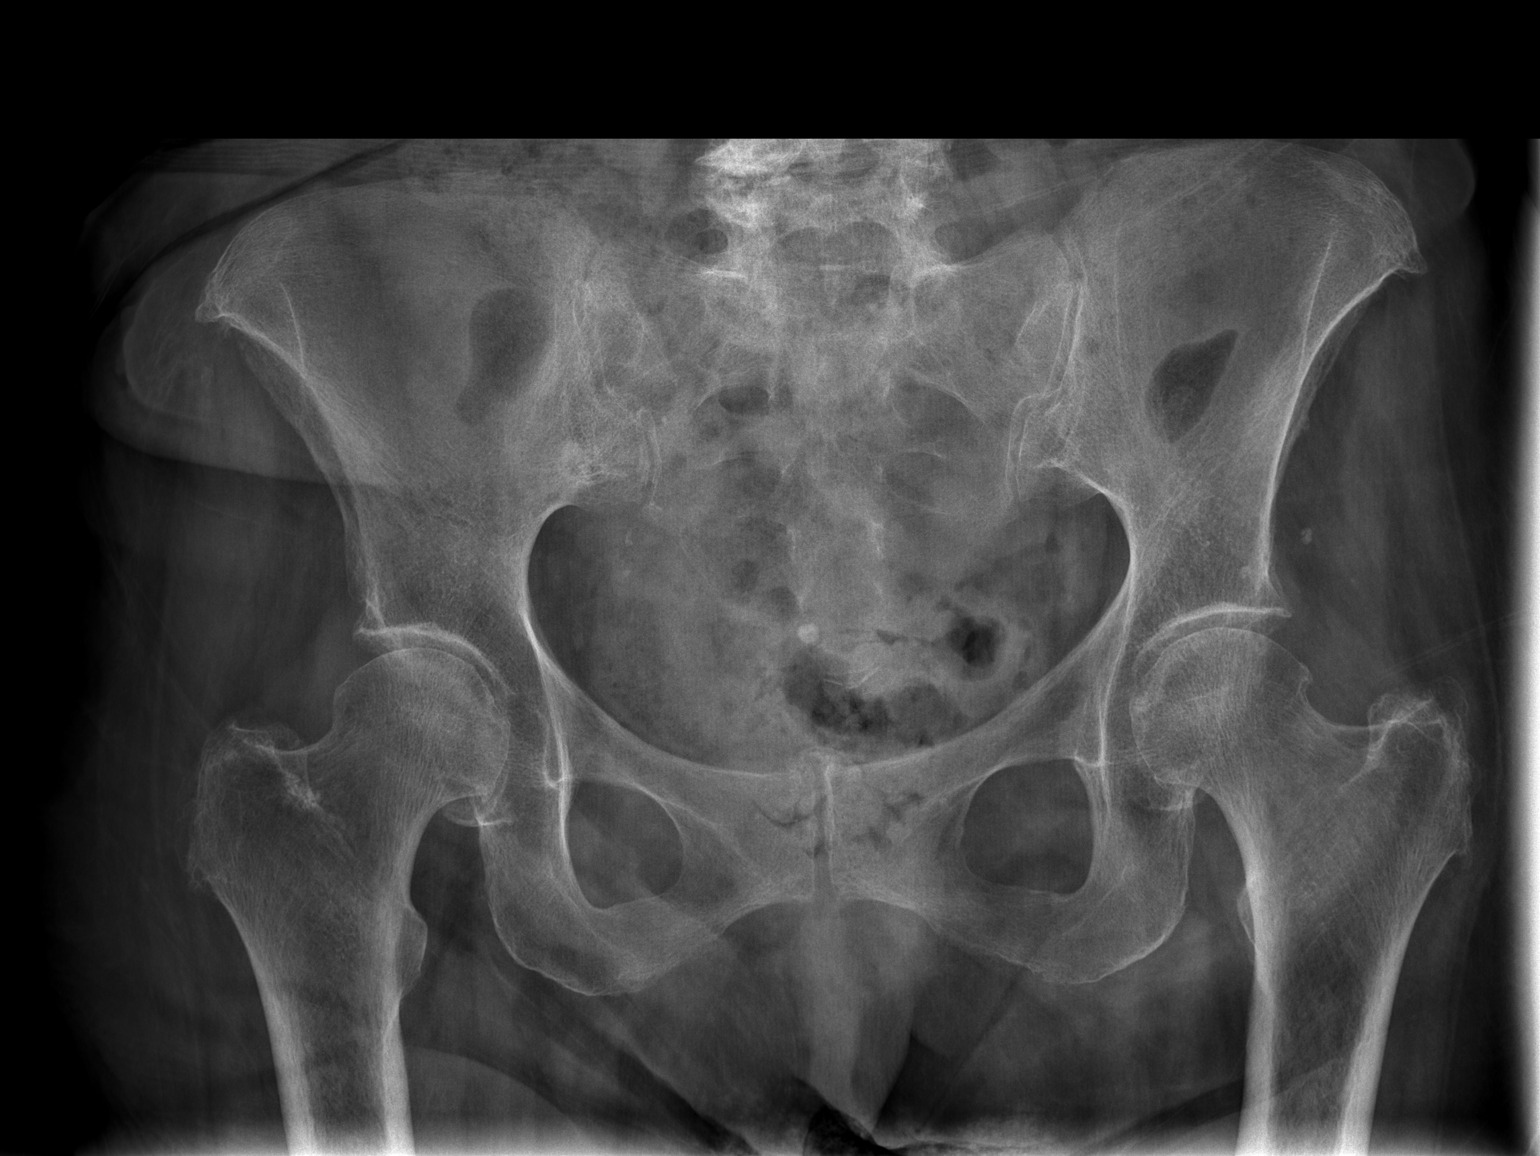

[t hip ap right]
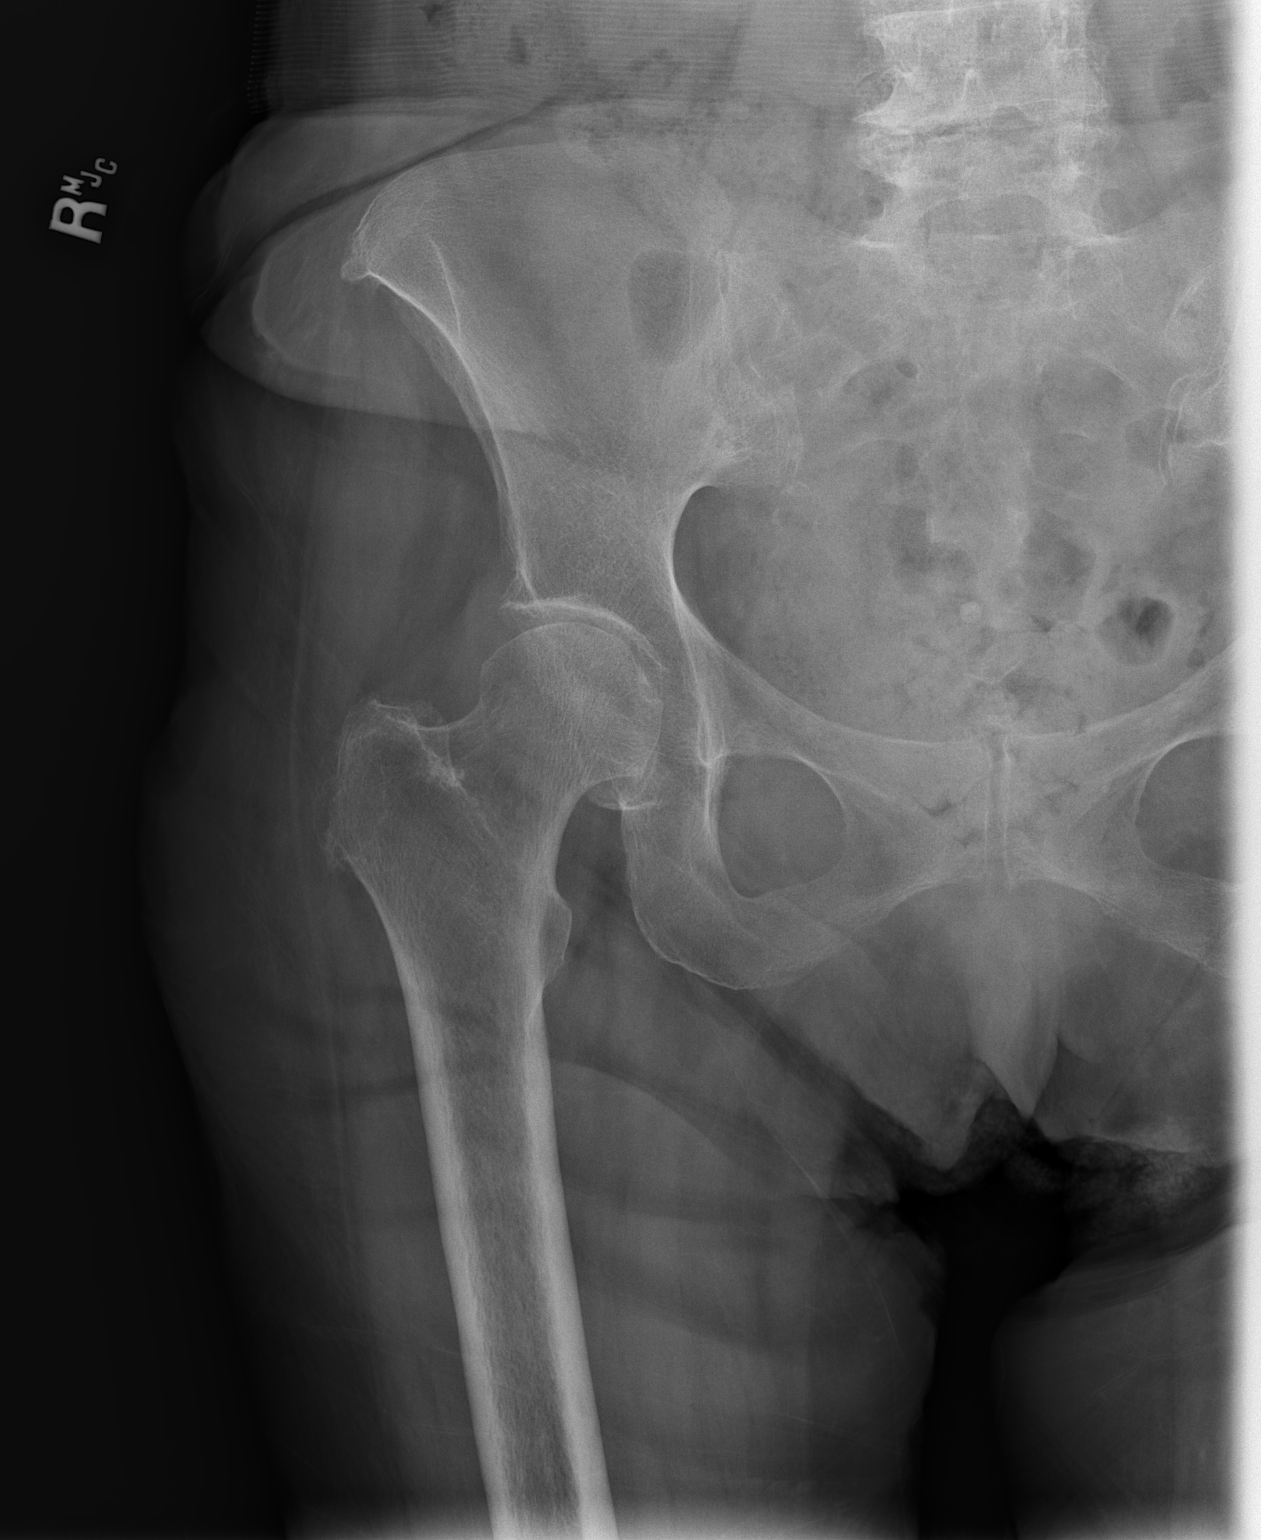

[t hip frog leg right]
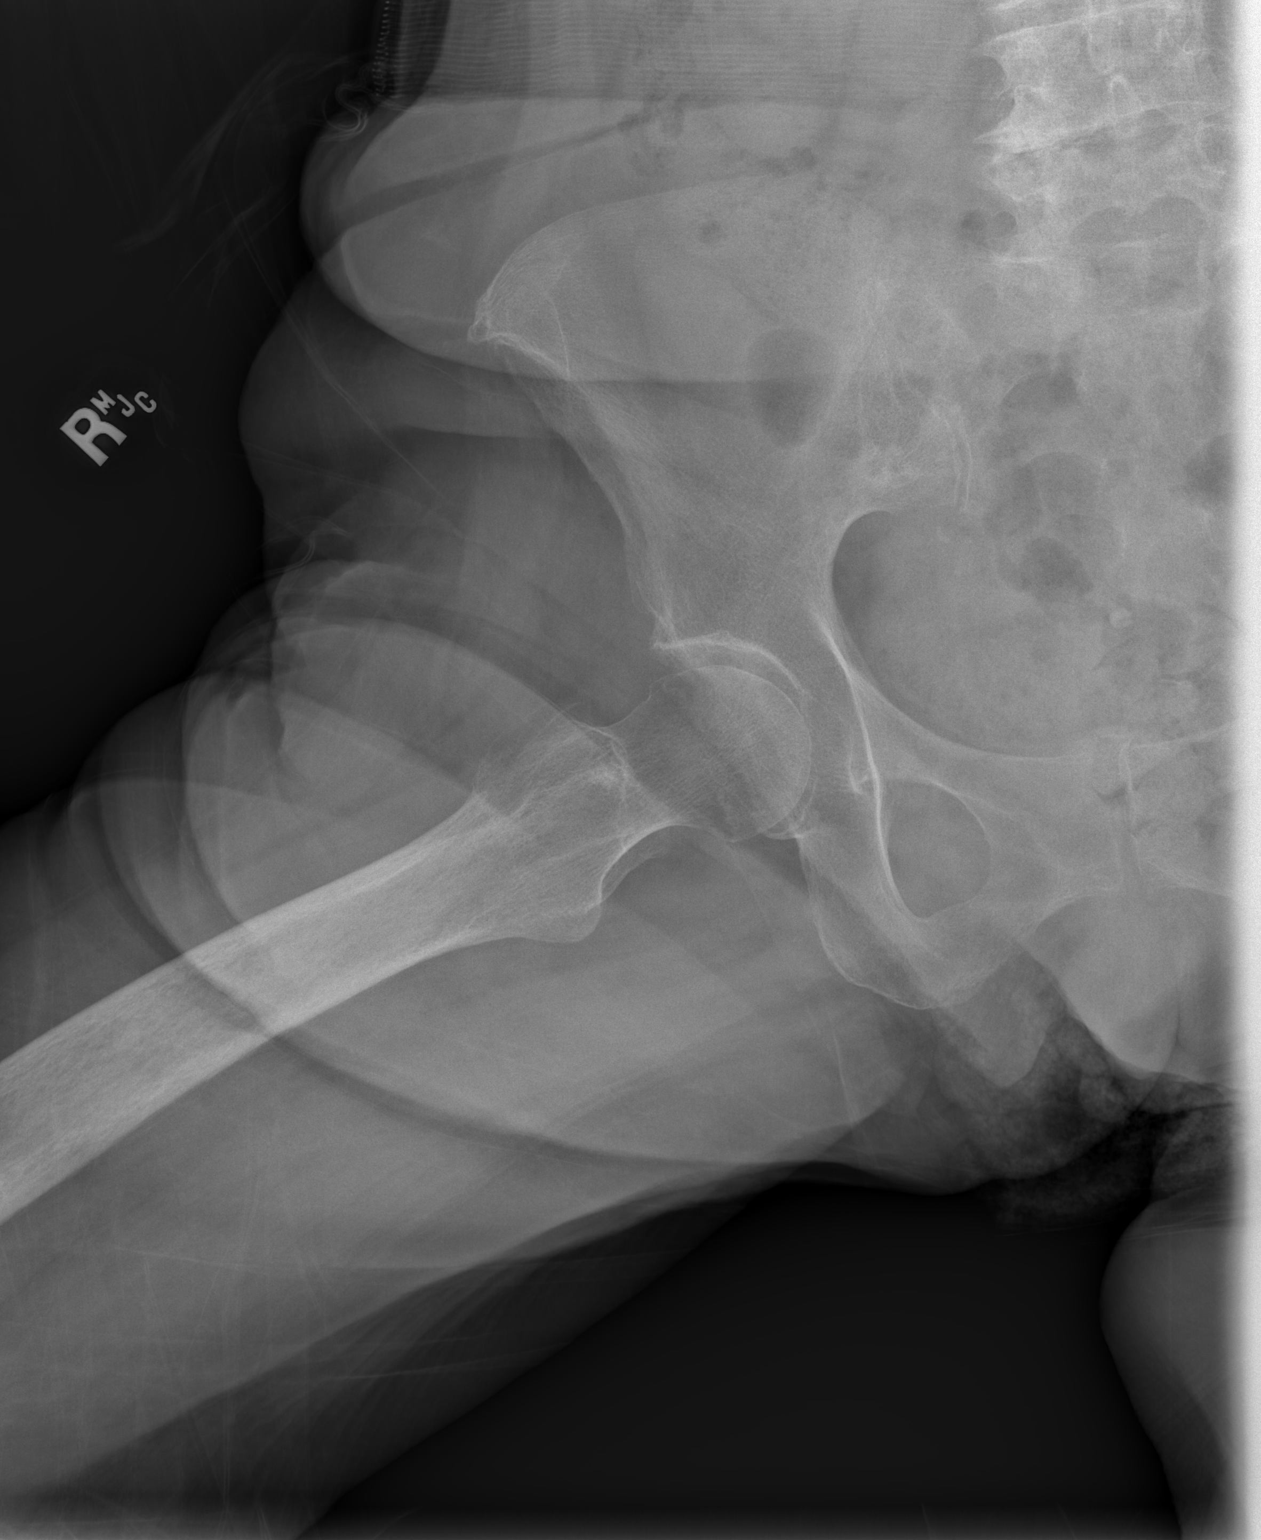

[3 of 3 positions shown; findings below may reference images not displayed]

FINDINGS: Diffuse osteopenia. No fracture or dislocation seen. Lower lumbar
spine degenerative changes. Mild left hip degenerative changes.
IMPRESSION: 1. No fracture or dislocation.
2. Lower lumbar spine degenerative changes and mild left hip
degenerative changes.
